# Patient Record
Sex: Female | Born: 1990 | Race: Black or African American | Hispanic: No | Marital: Single | Smoking: Never smoker
Health system: Southern US, Community
[De-identification: ages and names within clinical notes are randomized; demographics above are authoritative.]

## PROBLEM LIST (undated history)

## (undated) DIAGNOSIS — J45909 Unspecified asthma, uncomplicated: Secondary | ICD-10-CM

## (undated) DIAGNOSIS — L309 Dermatitis, unspecified: Secondary | ICD-10-CM

## (undated) HISTORY — PX: EYE SURGERY: SHX253

## (undated) HISTORY — PX: HERNIA REPAIR: SHX51

---
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
## 2012-07-23 ENCOUNTER — Encounter (HOSPITAL_COMMUNITY): Payer: Self-pay | Admitting: Emergency Medicine

## 2012-07-23 ENCOUNTER — Emergency Department (INDEPENDENT_AMBULATORY_CARE_PROVIDER_SITE_OTHER)
Admission: EM | Admit: 2012-07-23 | Discharge: 2012-07-23 | Disposition: A | Discharge status: Home / Self Care | Payer: Medicaid Other | Source: Home / Self Care | Attending: Family Medicine | Admitting: Family Medicine

## 2012-07-23 DIAGNOSIS — J069 Acute upper respiratory infection, unspecified: Secondary | ICD-10-CM

## 2012-07-23 MED ORDER — IPRATROPIUM BROMIDE 0.06 % NA SOLN: 2.0000 | Freq: Four times a day (QID) | NASAL | Status: DC

## 2012-07-23 NOTE — ED Provider Notes (Signed)
History     CSN: [id number]  Arrival date & time 07/23/12  1707   First MD Initiated Contact with Patient 07/23/12 1727      Chief Complaint  Patient presents with  . URI    (Consider location/radiation/quality/duration/timing/severity/associated sxs/prior treatment) Patient is a 21 y.o. female presenting with URI. The history is provided by the patient.  URI The primary symptoms include sore throat. Primary symptoms do not include fever, cough or wheezing. The current episode started more than 1 week ago. This is a new problem. The problem has not changed since onset. Symptoms associated with the illness include congestion and rhinorrhea.    History reviewed. No pertinent past medical history.  Past Surgical History  Procedure Date  . Eye surgery     No family history on file.  History  Substance Use Topics  . Smoking status: Never Smoker   . Smokeless tobacco: Not on file  . Alcohol Use: No    OB History    Grav Para Term Preterm Abortions TAB SAB Ect Mult Living                  Review of Systems  Constitutional: Negative.  Negative for fever.  HENT: Positive for congestion, sore throat, rhinorrhea and postnasal drip.   Respiratory: Negative for cough and wheezing.   Gastrointestinal: Negative.   Skin: Negative.     Allergies  Nickel  Home Medications   Current Outpatient Rx  Name  Route  Sig  Dispense  Refill  . IPRATROPIUM BROMIDE 0.06 % NA SOLN   Nasal   Place 2 sprays into the nose 4 (four) times daily.   15 mL   1     BP 132/85  Pulse 77  Temp 99.9 F (37.7 C) (Oral)  Resp 18  SpO2 99%  LMP 06/30/2012  Physical Exam  Nursing note and vitals reviewed. Constitutional: She is oriented to person, place, and time. She appears well-developed and well-nourished.  HENT:  Head: Normocephalic.  Right Ear: External ear normal.  Left Ear: External ear normal.  Nose: Mucosal edema and rhinorrhea present.  Mouth/Throat: Oropharynx is clear  and moist.  Neck: Normal range of motion. Neck supple.  Pulmonary/Chest: Breath sounds normal.  Abdominal: Soft. Bowel sounds are normal.  Lymphadenopathy:    She has no cervical adenopathy.  Neurological: She is alert and oriented to person, place, and time.  Skin: Skin is warm and dry.    ED Course  Procedures (including critical care time)   Labs Reviewed  POCT RAPID STREP A ([facility] ONLY)   No results found.   1. URI (upper respiratory infection)       MDM          Linna Hoff, MD 07/23/12 [phone]

## 2012-07-23 NOTE — ED Notes (Signed)
Pt c/o cold sx x2 weeks... Sx include: cough, sore throat, headaches, abd pain, dysphagia... Denies: fevers, vomiting, nauseas, diarrhea... She is alert w/no signs of acute distress.

## 2013-04-16 ENCOUNTER — Inpatient Hospital Stay (HOSPITAL_COMMUNITY)
Admission: AD | Admit: 2013-04-16 | Discharge: 2013-04-16 | Disposition: A | Discharge status: Home / Self Care | Payer: Medicaid Other | Source: Ambulatory Visit | Attending: Obstetrics & Gynecology | Admitting: Obstetrics & Gynecology

## 2013-04-16 ENCOUNTER — Encounter (HOSPITAL_COMMUNITY): Payer: Self-pay | Admitting: *Deleted

## 2013-04-16 DIAGNOSIS — N6089 Other benign mammary dysplasias of unspecified breast: Secondary | ICD-10-CM

## 2013-04-16 DIAGNOSIS — N6081 Other benign mammary dysplasias of right breast: Secondary | ICD-10-CM

## 2013-04-16 DIAGNOSIS — D2370 Other benign neoplasm of skin of unspecified lower limb, including hip: Secondary | ICD-10-CM | POA: Insufficient documentation

## 2013-04-16 DIAGNOSIS — L723 Sebaceous cyst: Secondary | ICD-10-CM | POA: Insufficient documentation

## 2013-04-16 DIAGNOSIS — N63 Unspecified lump in unspecified breast: Secondary | ICD-10-CM | POA: Insufficient documentation

## 2013-04-16 DIAGNOSIS — N6019 Diffuse cystic mastopathy of unspecified breast: Secondary | ICD-10-CM | POA: Insufficient documentation

## 2013-04-16 HISTORY — DX: Unspecified asthma, uncomplicated: J45.909

## 2013-04-16 HISTORY — DX: Dermatitis, unspecified: L30.9

## 2013-04-16 LAB — POCT PREGNANCY, URINE: Preg Test, Ur: NEGATIVE

## 2013-04-16 LAB — URINALYSIS, ROUTINE W REFLEX MICROSCOPIC
Bilirubin Urine: NEGATIVE
Glucose, UA: NEGATIVE mg/dL
Hgb urine dipstick: NEGATIVE
Ketones, ur: NEGATIVE mg/dL
Protein, ur: NEGATIVE mg/dL

## 2013-04-16 NOTE — MAU Provider Note (Signed)
History     CSN: [id number]  Arrival date and time: 04/16/13 1616   First Provider Initiated Contact with Patient 04/16/13 1657      Chief Complaint  Patient presents with  . lump in right breast    HPI Shawna George is 22 y.o. presents for evaluation of a lump in the right breast.  Denies nipple discharge.  She first noticed it 4 months.  Was larger initially but now smaller.  Denies tenderness.  It does not change with her menstrual cycles.  LMP 03/17/13.  She has 2 small moles on her right thigh that have been there "forever" but wants them examined.  Never sexually active.     Past Medical History  Diagnosis Date  . Eczema     bilat arms  . Asthma     Past Surgical History  Procedure Laterality Date  . Eye surgery      Family History  Problem Relation Age of Onset  . Cancer Mother   . Lupus Mother   . Stroke Mother   . Hypertension Mother   . Hyperlipidemia Mother   . Depression Mother   . Hypertension Father   . Cancer Maternal Grandmother     History  Substance Use Topics  . Smoking status: Never Smoker   . Smokeless tobacco: Not on file  . Alcohol Use: No    Allergies:  Allergies  Allergen Reactions  . Nickel Hives and Rash    No prescriptions prior to admission    ROS  Neg for fever, chills HENT-neg Breast-right breast+ for lump, Left breast Neg Abd-Neg for pain Pelvic=Neg for abnormal vaginal bleeding or discharge   Physical Exam   Blood pressure 133/79, pulse 75, temperature 98.2 F (36.8 C), temperature source Oral, resp. rate 18, height 5\' 11"  (1.803 m), weight 166 lb (75.297 kg), last menstrual period 03/17/2013.\   Physical Exam Patient is alert, oriented without distress Breast: Scattered Fibrocystic breast tissue bilaterally without dominant masses or nipple discharge                At the edge of the right nipple at 11 o'clock, there is a firm nodule that is clearly under the skin, not involving the breast tissue.  It is  without redness, tenderness or discharge.   Right lateral thigh--patient points out 2 small dark brown moles that are round with clear borders.  Non tender to touch   Results for orders placed during the hospital encounter of 04/16/13 (from the past 24 hour(s))  URINALYSIS, ROUTINE W REFLEX MICROSCOPIC     Status: Abnormal   Collection Time    04/16/13  4:30 PM      Result Value Range   Color, Urine YELLOW  YELLOW   APPearance CLEAR  CLEAR   Specific Gravity, Urine >1.030 (*) 1.005 - 1.030   pH 6.0  5.0 - 8.0   Glucose, UA NEGATIVE  NEGATIVE mg/dL   Hgb urine dipstick NEGATIVE  NEGATIVE   Bilirubin Urine NEGATIVE  NEGATIVE   Ketones, ur NEGATIVE  NEGATIVE mg/dL   Protein, ur NEGATIVE  NEGATIVE mg/dL   Urobilinogen, UA 0.2  0.0 - 1.0 mg/dL   Nitrite NEGATIVE  NEGATIVE   Leukocytes, UA NEGATIVE  NEGATIVE  POCT PREGNANCY, URINE     Status: None   Collection Time    04/16/13  4:37 PM      Result Value Range   Preg Test, Ur NEGATIVE  NEGATIVE   MAU Course  Procedures  MDM   Assessment and Plan  A:  Sebaceous cyst right nipple      Fibrocystic breast       2 small moles on her outer right thigh  P:  Encourage patient not to try to squeeze the cyst       Instructed that if area becomes red or tender, she will need to see a PCP or GYN for evaluation       Also if the moles change, she will need to see dermatologist.   Matt Holmes 04/16/2013, 4:59 PM

## 2013-04-16 NOTE — MAU Note (Signed)
Pt c/o non-tender,right breast lump, approx size of M&M for the past 4 months. Denies any bleeding or leaking from nipple. Never has been sexually active.

## 2013-04-16 NOTE — MAU Note (Signed)
Pt presents with complaints of a lump in her right breast 4 months ago. She states that she told her mother about the lump today and she told her to come and get it evaluated.

## 2013-05-17 ENCOUNTER — Emergency Department (HOSPITAL_COMMUNITY): Payer: Medicaid Other

## 2013-05-17 ENCOUNTER — Emergency Department (HOSPITAL_COMMUNITY)
Admission: EM | Admit: 2013-05-17 | Discharge: 2013-05-17 | Disposition: A | Discharge status: Home / Self Care | Payer: Medicaid Other | Source: Home / Self Care

## 2013-05-17 ENCOUNTER — Emergency Department (INDEPENDENT_AMBULATORY_CARE_PROVIDER_SITE_OTHER): Payer: Medicaid Other

## 2013-05-17 ENCOUNTER — Encounter (HOSPITAL_COMMUNITY): Payer: Self-pay | Admitting: Emergency Medicine

## 2013-05-17 DIAGNOSIS — M79604 Pain in right leg: Secondary | ICD-10-CM

## 2013-05-17 DIAGNOSIS — M79609 Pain in unspecified limb: Secondary | ICD-10-CM

## 2013-05-17 NOTE — ED Notes (Signed)
Pt  Reports  She   inj  l  Leg  6  Days  Ago  She  Reports   She  Was  Chopping  Wood  And  A  Projectile  flew   And hit  Her l  Evette Cristal      She  Has  Bruising  Present    With  Pain on  Palpation

## 2013-05-17 NOTE — ED Provider Notes (Signed)
CSN: [id number]     Arrival date & time 05/17/13  1020 History   None    Chief Complaint  Patient presents with  . Leg Injury   HPI Pleasant 22 year old [place] American female with no significant past medical history who was chopping wood about a week ago for grandparents when the stump that she was chopping flu floor and hit her on her anterior right shin. She has been tolerating 6 on 10 pain which is localized primary to that area and has been able to ambulate but with difficulty she's not tried any medications for this. 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 Eczema     bilat arms  . Asthma    Past Surgical History  Procedure Laterality Date  . Eye surgery     Family History  Problem Relation Age of Onset  . Cancer Mother   . Lupus Mother   . Stroke Mother   . Hypertension Mother   . Hyperlipidemia Mother   . Depression Mother   . Hypertension Father   . Cancer Maternal Grandmother    History  Substance Use Topics  . Smoking status: Never Smoker   . Smokeless tobacco: Not on file  . Alcohol Use: No   OB History   Grav Para Term Preterm Abortions TAB SAB Ect Mult Living                 Review of Systems No cough no cold no fever no chills no nausea no vomiting Pain in the right lower extremity 6/10 Not always in no erythema  Allergies  Nickel  Home Medications  No current outpatient prescriptions on file. 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 Inversion eversion of the ankle reveals no tenderness or pain  ED Course  Procedures (including critical care time) Labs Review Labs Reviewed - No data to display Imaging Review No results found.  EKG Interpretation     Ventricular Rate:    PR Interval:    QRS Duration:   QT Interval:    QTC Calculation:   R Axis:     Text Interpretation:              MDM  No diagnosis found. 11:15 AM ordered foot x-ray/tibia-fibula x-ray to rule out any occult fracture although I think this is unlikely  11:36 AM Foot x-ray/tibia-fibula x-ray my read = no acute fracture explained to patient that this will probably all resolve in the next 10-15 days in terms of the swelling, and that the ecchymosis at the bottom of the ankle is probably because of dependency. She doesn't require now for work and does not want any medications for pain.        Rhetta Mura, MD 05/17/13 1136

## 2014-08-03 ENCOUNTER — Emergency Department (HOSPITAL_COMMUNITY): Payer: BLUE CROSS/BLUE SHIELD

## 2014-08-03 ENCOUNTER — Emergency Department (HOSPITAL_COMMUNITY)
Admission: EM | Admit: 2014-08-03 | Discharge: 2014-08-03 | Disposition: A | Discharge status: Home / Self Care | Payer: BLUE CROSS/BLUE SHIELD | Attending: Emergency Medicine | Admitting: Emergency Medicine

## 2014-08-03 ENCOUNTER — Encounter (HOSPITAL_COMMUNITY): Payer: Self-pay | Admitting: *Deleted

## 2014-08-03 DIAGNOSIS — Y998 Other external cause status: Secondary | ICD-10-CM | POA: Diagnosis not present

## 2014-08-03 DIAGNOSIS — W270XXA Contact with workbench tool, initial encounter: Secondary | ICD-10-CM | POA: Insufficient documentation

## 2014-08-03 DIAGNOSIS — Z23 Encounter for immunization: Secondary | ICD-10-CM | POA: Diagnosis not present

## 2014-08-03 DIAGNOSIS — Y9389 Activity, other specified: Secondary | ICD-10-CM | POA: Diagnosis not present

## 2014-08-03 DIAGNOSIS — Z872 Personal history of diseases of the skin and subcutaneous tissue: Secondary | ICD-10-CM | POA: Insufficient documentation

## 2014-08-03 DIAGNOSIS — Y9289 Other specified places as the place of occurrence of the external cause: Secondary | ICD-10-CM | POA: Diagnosis not present

## 2014-08-03 DIAGNOSIS — T1490XA Injury, unspecified, initial encounter: Secondary | ICD-10-CM

## 2014-08-03 DIAGNOSIS — T148XXA Other injury of unspecified body region, initial encounter: Secondary | ICD-10-CM

## 2014-08-03 DIAGNOSIS — J45909 Unspecified asthma, uncomplicated: Secondary | ICD-10-CM | POA: Diagnosis not present

## 2014-08-03 DIAGNOSIS — S81832A Puncture wound without foreign body, left lower leg, initial encounter: Secondary | ICD-10-CM | POA: Insufficient documentation

## 2014-08-03 MED ORDER — TETANUS-DIPHTH-ACELL PERTUSSIS 5-2.5-18.5 LF-MCG/0.5 IM SUSP
0.5000 mL | Freq: Once | INTRAMUSCULAR | Status: AC
  Administered 2014-08-03: 0.5 mL via INTRAMUSCULAR
  Filled 2014-08-03: qty 0.5

## 2014-08-03 NOTE — ED Provider Notes (Signed)
CSN: [id number]     Arrival date & time 08/03/14  1125 History  This chart was scribed for non-physician practitioner, Glendell Docker, NP-C working with Charlesetta Shanks, MD by Einar Pheasant, ED scribe. This patient was seen in room TR07C/TR07C and the patient's care was started at 12:51 PM.    Chief Complaint  Patient presents with  . Foreign Body   The history is provided by the patient. No language interpreter was used.   HPI Comments: Shawna George is a 24 y.o. female with a PMhx of Eczema and asthama presents to the Emergency Department complaining of a possible foreign body to anterior aspect of left leg. Pt states that she was chopping wood with a metal wedge and she things that a piece of metal or wood is lodged in her left leg. Pt has an open wound to the affected leg. Bleeding is controlled. Pt does not recall the date of her last tetanus vaccine. Denies any fever, chills, nausea, emesis, abdominal pain, weakness, numbness, leg swelling, or chest pain.   Past Medical History  Diagnosis Date  . Eczema     bilat arms  . Asthma    Past Surgical History  Procedure Laterality Date  . Eye surgery    . Hernia repair     Family History  Problem Relation Age of Onset  . Cancer Mother   . Lupus Mother   . Stroke Mother   . Hypertension Mother   . Hyperlipidemia Mother   . Depression Mother   . Hypertension Father   . Cancer Maternal Grandmother    History  Substance Use Topics  . Smoking status: Never Smoker   . Smokeless tobacco: Not on file  . Alcohol Use: Yes   OB History    No data available     Review of Systems  Constitutional: Negative for fever and chills.  HENT: Negative for congestion.   Respiratory: Negative for shortness of breath.   Cardiovascular: Negative for chest pain and leg swelling.  Gastrointestinal: Negative for abdominal pain.  Musculoskeletal: Negative for back pain.  Skin: Positive for wound.  Neurological: Negative for weakness,  numbness and headaches.  All other systems reviewed and are negative.     Allergies  Nickel  Home Medications   Prior to Admission medications   Not on File   BP 132/78 mmHg  Pulse 81  Temp(Src) 98.7 F (37.1 C) (Oral)  Resp 18  Ht 6' (1.829 m)  Wt 175 lb (79.379 kg)  BMI 23.73 kg/m2  SpO2 100%  Physical Exam  Constitutional: She is oriented to person, place, and time. She appears well-developed and well-nourished. No distress.  HENT:  Head: Normocephalic and atraumatic.  Eyes: Conjunctivae and EOM are normal.  Neck: Neck supple.  Cardiovascular: Normal rate.   Pulmonary/Chest: Effort normal. No respiratory distress.  Musculoskeletal: Normal range of motion.  Neurological: She is alert and oriented to person, place, and time.  Skin: Skin is warm and dry.  .5cm puncture wound  To the left shin, no redness or warmth noted to the area  Psychiatric: She has a normal mood and affect. Her behavior is normal.  Nursing note and vitals reviewed.   ED Course  Procedures (including critical care time)  DIAGNOSTIC STUDIES: Oxygen Saturation is 100% on RA, normal by my interpretation.    COORDINATION OF CARE: 12:53 PM- Will order X-ray of left leg. Pt advised of plan for treatment and pt agrees.   Imaging Review Dg Tibia/fibula Left  08/03/2014   CLINICAL DATA:  Injured while splitting wood with left leg laceration  EXAM: LEFT TIBIA AND FIBULA - 2 VIEW  COMPARISON:  None.  FINDINGS: Soft tissue defect is noted in the anterior lower leg consistent with a recent history. No acute bony abnormality is seen.  IMPRESSION: Soft tissue injury without acute fracture.   Electronically Signed   By: Inez Catalina M.D.   On: 08/03/2014 13:12     MDM   Final diagnoses:  Puncture wound    No fb noted.no sign of infection to the area. Tetanus updated. Discussed return precautions  I personally performed the services described in this documentation, which was scribed in my presence.  The recorded information has been reviewed and is accurate.    Glendell Docker, NP 08/03/14 [place], MD 08/05/14 [phone]

## 2014-08-03 NOTE — Discharge Instructions (Signed)
Puncture Wound °A puncture wound is an injury that extends through all layers of the skin and into the tissue beneath the skin (subcutaneous tissue). Puncture wounds become infected easily because germs often enter the body and go beneath the skin during the injury. Having a deep wound with a small entrance point makes it difficult for your caregiver to adequately clean the wound. This is especially true if you have stepped on a nail and it has passed through a dirty shoe or other situations where the wound is obviously contaminated. °CAUSES  °Many puncture wounds involve glass, nails, splinters, fish hooks, or other objects that enter the skin (foreign bodies). A puncture wound may also be caused by a human bite or animal bite. °DIAGNOSIS  °A puncture wound is usually diagnosed by your history and a physical exam. You may need to have an X-ray or an ultrasound to check for any foreign bodies still in the wound. °TREATMENT  °· Your caregiver will clean the wound as thoroughly as possible. Depending on the location of the wound, a bandage (dressing) may be applied. °· Your caregiver might prescribe antibiotic medicines. °· You may need a follow-up visit to check on your wound. Follow all instructions as directed by your caregiver. °HOME CARE INSTRUCTIONS  °· Change your dressing once per day, or as directed by your caregiver. If the dressing sticks, it may be removed by soaking the area in water. °· If your caregiver has given you follow-up instructions, it is very important that you return for a follow-up appointment. Not following up as directed could result in a chronic or permanent injury, pain, and disability. °· Only take over-the-counter or prescription medicines for pain, discomfort, or fever as directed by your caregiver. °· If you are given antibiotics, take them as directed. Finish them even if you start to feel better. °You may need a tetanus shot if: °· You cannot remember when you had your last tetanus  shot. °· You have never had a tetanus shot. °If you got a tetanus shot, your arm may swell, get red, and feel warm to the touch. This is common and not a problem. If you need a tetanus shot and you choose not to have one, there is a rare chance of getting tetanus. Sickness from tetanus can be serious. °You may need a rabies shot if an animal bite caused your puncture wound. °SEEK MEDICAL CARE IF:  °· You have redness, swelling, or increasing pain in the wound. °· You have red streaks going away from the wound. °· You notice a bad smell coming from the wound or dressing. °· You have yellowish-white fluid (pus) coming from the wound. °· You are treated with an antibiotic for infection, but the infection is not getting better. °· You notice something in the wound, such as rubber from your shoe, cloth, or another object. °· You have a fever. °· You have severe pain. °· You have difficulty breathing. °· You feel dizzy or faint. °· You cannot stop vomiting. °· You lose feeling, develop numbness, or cannot move a limb below the wound. °· Your symptoms worsen. °MAKE SURE YOU: °· Understand these instructions. °· Will watch your condition. °· Will get help right away if you are not doing well or get worse. °Document Released: 04/22/2005 Document Revised: 10/05/2011 Document Reviewed: 12/30/2010 °Exit[employer]® Patient Information ©2015 [employer]. This information is not intended to replace advice given to you by your health care provider. Make sure you discuss any questions you   have with your health care provider. ° °

## 2014-08-03 NOTE — ED Notes (Signed)
Patient was chopping wood with a metal wedge.  She states she thinks a piece of metal or wood is in her left leg.  She has an open wound with bandaid.  Patient has small amount of bleeding.  Patient with no fevers.  Patient has pain with weight bearing and twisting motion.  Patient is unsure about her tetenus.  She did not take any pain meds prior to arrival

## 2016-02-22 ENCOUNTER — Emergency Department (HOSPITAL_COMMUNITY)
Admission: EM | Admit: 2016-02-22 | Discharge: 2016-02-22 | Disposition: A | Discharge status: Home / Self Care | Payer: Self-pay | Attending: Emergency Medicine | Admitting: Emergency Medicine

## 2016-02-22 ENCOUNTER — Encounter (HOSPITAL_COMMUNITY): Payer: Self-pay

## 2016-02-22 ENCOUNTER — Emergency Department (HOSPITAL_COMMUNITY): Payer: Self-pay

## 2016-02-22 DIAGNOSIS — J45909 Unspecified asthma, uncomplicated: Secondary | ICD-10-CM | POA: Insufficient documentation

## 2016-02-22 DIAGNOSIS — N12 Tubulo-interstitial nephritis, not specified as acute or chronic: Secondary | ICD-10-CM | POA: Insufficient documentation

## 2016-02-22 DIAGNOSIS — N390 Urinary tract infection, site not specified: Secondary | ICD-10-CM | POA: Insufficient documentation

## 2016-02-22 LAB — URINALYSIS, ROUTINE W REFLEX MICROSCOPIC
Bilirubin Urine: NEGATIVE
Glucose, UA: NEGATIVE mg/dL
Ketones, ur: NEGATIVE mg/dL
NITRITE: NEGATIVE
PROTEIN: 100 mg/dL — AB
Specific Gravity, Urine: 1.016 (ref 1.005–1.030)
pH: 7.5 (ref 5.0–8.0)

## 2016-02-22 LAB — COMPREHENSIVE METABOLIC PANEL
ALBUMIN: 4.5 g/dL (ref 3.5–5.0)
ALT: 16 U/L (ref 14–54)
ANION GAP: 6 (ref 5–15)
AST: 21 U/L (ref 15–41)
Alkaline Phosphatase: 55 U/L (ref 38–126)
BILIRUBIN TOTAL: 1.2 mg/dL (ref 0.3–1.2)
BUN: 11 mg/dL (ref 6–20)
CO2: 26 mmol/L (ref 22–32)
Calcium: 9.4 mg/dL (ref 8.9–10.3)
Chloride: 104 mmol/L (ref 101–111)
Creatinine, Ser: 0.85 mg/dL (ref 0.44–1.00)
GFR calc Af Amer: >60 mL/min (ref >60)
GFR calc non Af Amer: >60 mL/min (ref >60)
GLUCOSE: 113 mg/dL — AB (ref 65–99)
POTASSIUM: 4.1 mmol/L (ref 3.5–5.1)
SODIUM: 136 mmol/L (ref 135–145)
TOTAL PROTEIN: 7.9 g/dL (ref 6.5–8.1)

## 2016-02-22 LAB — CBC
HEMATOCRIT: 38.6 % (ref 36.0–46.0)
HEMOGLOBIN: 12.7 g/dL (ref 12.0–15.0)
MCH: 27.9 pg (ref 26.0–34.0)
MCHC: 32.9 g/dL (ref 30.0–36.0)
MCV: 84.6 fL (ref 78.0–100.0)
Platelets: 281 10*3/uL (ref 150–400)
RBC: 4.56 MIL/uL (ref 3.87–5.11)
RDW: 13.6 % (ref 11.5–15.5)
WBC: 14.5 10*3/uL — ABNORMAL HIGH (ref 4.0–10.5)

## 2016-02-22 LAB — URINE MICROSCOPIC-ADD ON

## 2016-02-22 LAB — POC URINE PREG, ED: PREG TEST UR: NEGATIVE

## 2016-02-22 LAB — LIPASE, BLOOD: LIPASE: 26 U/L (ref 11–51)

## 2016-02-22 MED ORDER — PROMETHAZINE HCL 25 MG PO TABS: 25.0000 mg | ORAL_TABLET | Freq: Four times a day (QID) | ORAL | 0 refills | Status: AC | PRN

## 2016-02-22 MED ORDER — CEPHALEXIN 500 MG PO CAPS: 500.0000 mg | ORAL_CAPSULE | Freq: Three times a day (TID) | ORAL | 0 refills | Status: AC

## 2016-02-22 MED ORDER — DEXTROSE 5 % IV SOLN
1.0000 g | Freq: Once | INTRAVENOUS | Status: AC
  Administered 2016-02-22: 1 g via INTRAVENOUS
  Filled 2016-02-22: qty 10

## 2016-02-22 MED ORDER — KETOROLAC TROMETHAMINE 30 MG/ML IJ SOLN
30.0000 mg | Freq: Once | INTRAMUSCULAR | Status: AC
  Administered 2016-02-22: 30 mg via INTRAVENOUS
  Filled 2016-02-22: qty 1

## 2016-02-22 MED ORDER — SODIUM CHLORIDE 0.9 % IV BOLUS (SEPSIS)
1000.0000 mL | Freq: Once | INTRAVENOUS | Status: AC
  Administered 2016-02-22: 1000 mL via INTRAVENOUS

## 2016-02-22 NOTE — ED Triage Notes (Signed)
She c/o right-sided low abd. Discomfort x 2-3 days; also she has seen some blood in her urine.  She is in no distress.

## 2016-02-22 NOTE — ED Notes (Signed)
[place] will arrive from [facility] to perform US renal exam.

## 2016-02-22 NOTE — ED Provider Notes (Signed)
[facility] DEPT Provider Note   CSN: [id number] Arrival date & time: 02/22/16  1059  First Provider Contact:  First MD Initiated Contact with Patient 02/22/16 1220        History   Chief Complaint Chief Complaint  Patient presents with  . Abdominal Pain    HPI Shawna George is a 25 y.o. female.  25 year old female with history of asthma presents for back pain and right lower abdominal pain. The patient states that she has developed pain on her right side over the last 2-3 days. She reports she has also developed blood in her urine. She has felt chilled. She has also been fatigued. She denies any nausea or vomiting. No diarrhea or constipation. She denies any fever.      Past Medical History:  Diagnosis Date  . Asthma   . Eczema    bilat arms    There are no active problems to display for this patient.   Past Surgical History:  Procedure Laterality Date  . EYE SURGERY    . HERNIA REPAIR      OB History    No data available       Home Medications    Prior to Admission medications   Not on File    Family History Family History  Problem Relation Age of Onset  . Cancer Mother   . Lupus Mother   . Stroke Mother   . Hypertension Mother   . Hyperlipidemia Mother   . Depression Mother   . Hypertension Father   . Cancer Maternal Grandmother     Social History Social History  Substance Use Topics  . Smoking status: Never Smoker  . Smokeless tobacco: Not on file  . Alcohol use Yes     Allergies   Nickel   Review of Systems Review of Systems  Constitutional: Positive for chills and fatigue. Negative for diaphoresis and fever.  HENT: Negative for congestion, rhinorrhea and sinus pressure.   Eyes: Negative for visual disturbance.  Respiratory: Negative for cough, chest tightness, shortness of breath and wheezing.   Cardiovascular: Negative for chest pain and palpitations.  Gastrointestinal: Positive for abdominal pain. Negative for  constipation, diarrhea, nausea and vomiting.  Genitourinary: Positive for dysuria, flank pain (right) and hematuria. Negative for urgency.  Musculoskeletal: Positive for back pain. Negative for myalgias.  Skin: Negative for rash.  Neurological: Negative for dizziness, weakness, light-headedness and headaches.  Hematological: Does not bruise/bleed easily.     Physical Exam Updated Vital Signs BP 130/86 (BP Location: Left Arm)   Pulse 60   Temp 97.4 F (36.3 C) (Oral)   Resp 19   LMP 02/14/2016 (Exact Date)   SpO2 99%   Physical Exam  Constitutional: She is oriented to person, place, and time. She appears well-developed and well-nourished. No distress.  HENT:  Head: Normocephalic and atraumatic.  Right Ear: External ear normal.  Left Ear: External ear normal.  Nose: Nose normal.  Mouth/Throat: Oropharynx is clear and moist. No oropharyngeal exudate.  Eyes: EOM are normal. Pupils are equal, round, and reactive to light.  Neck: Normal range of motion. Neck supple.  Cardiovascular: Normal rate, regular rhythm, normal heart sounds and intact distal pulses.   No murmur heard. Pulmonary/Chest: Effort normal. No respiratory distress. She has no wheezes. She has no rales.  Abdominal: Soft. She exhibits no distension. There is no tenderness. There is CVA tenderness (right-sided).  Musculoskeletal: Normal range of motion. She exhibits no edema or tenderness.  Neurological: She is  alert and oriented to person, place, and time.  Skin: Skin is warm and dry. No rash noted. She is not diaphoretic.  Vitals reviewed.    ED Treatments / Results  Labs (all labs ordered are listed, but only abnormal results are displayed) Labs Reviewed  URINALYSIS, ROUTINE W REFLEX MICROSCOPIC (NOT AT [facility]) - Abnormal; Notable for the following:       Result Value   APPearance CLOUDY (*)    Hgb urine dipstick LARGE (*)    Protein, ur 100 (*)    Leukocytes, UA MODERATE (*)    All other components within  normal limits  COMPREHENSIVE METABOLIC PANEL - Abnormal; Notable for the following:    Glucose, Bld 113 (*)    All other components within normal limits  CBC - Abnormal; Notable for the following:    WBC 14.5 (*)    All other components within normal limits  URINE MICROSCOPIC-ADD ON - Abnormal; Notable for the following:    Squamous Epithelial / LPF 0-5 (*)    Bacteria, UA FEW (*)    All other components within normal limits  URINE CULTURE  LIPASE, BLOOD  POC URINE PREG, ED    EKG  EKG Interpretation None       Radiology No results found.  Procedures Procedures (including critical care time)  Medications Ordered in ED Medications  ketorolac (TORADOL) 30 MG/ML injection 30 mg (30 mg Intravenous Given 02/22/16 1328)  sodium chloride 0.9 % bolus 1,000 mL (0 mLs Intravenous Stopped 02/22/16 1520)  cefTRIAXone (ROCEPHIN) 1 g in dextrose 5 % 50 mL IVPB (0 g Intravenous Stopped 02/22/16 1407)     Initial Impression / Assessment and Plan / ED Course  I have reviewed the triage vital signs and the nursing notes.  Pertinent labs & imaging results that were available during my care of the patient were reviewed by me and considered in my medical decision making (see chart for details).  Clinical Course    Patient was seen and evaluated in stable condition.  Laboratory studies consistent with pyelonephritis. Patient with right-sided CVA tenderness. No abdominal tenderness. On reevaluation patient felt significantly improved after IV fluids and Toradol. Patient was given a dose of Rocephin.  Patient signed out pending renal US for evaluation of obstruction.  Plan for discharge on Keflex if no sign of urinary obstruction.  Final Clinical Impressions(s) / ED Diagnoses   Final diagnoses:  Pyelonephritis    New Prescriptions New Prescriptions   No medications on file     Harvel Quale, MD 02/22/16 [phone]

## 2016-02-22 NOTE — ED Notes (Signed)
Pt. Verbalized understanding of teaching.

## 2016-02-25 LAB — URINE CULTURE: Culture: >=100000 — AB

## 2016-02-26 ENCOUNTER — Telehealth (HOSPITAL_BASED_OUTPATIENT_CLINIC_OR_DEPARTMENT_OTHER): Payer: Self-pay | Admitting: Emergency Medicine

## 2016-02-26 NOTE — Telephone Encounter (Signed)
Post ED Visit - Positive Culture Follow-up  Culture report reviewed by antimicrobial stewardship pharmacist:  []  Elenor Quinones, Pharm.D. []  Heide Guile, Pharm.D., BCPS []  Parks Neptune, Pharm.D. []  Alycia Rossetti, Pharm.D., BCPS []  [place], [place].D., BCPS, AAHIVP []  Legrand Como, Pharm.D., BCPS, AAHIVP []  Milus Glazier, Pharm.D. []  Stephens November, Pharm.D. Dimitri Ped PharmD  Positive urine culture Treated with cephalexin, organism sensitive to the same and no further patient follow-up is required at this time.  Hazle Nordmann 02/26/2016, 9:31 AM

## 2016-12-23 IMAGING — US US RENAL
1 series · 14 of 25 positions shown · non-contrast
Comparison: None.

CLINICAL DATA: Right flank pain for 3 days. Extreme pain today.
Hematuria.

EXAM:
RENAL / URINARY TRACT ULTRASOUND COMPLETE

[Series 1: us renal · 0.19mm/px · 14 of 29 slices shown]
[im 1/29]
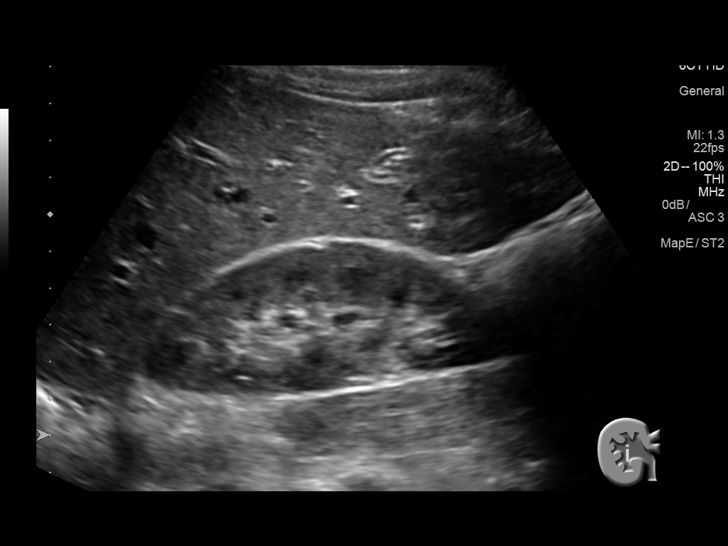
[im 3/29]
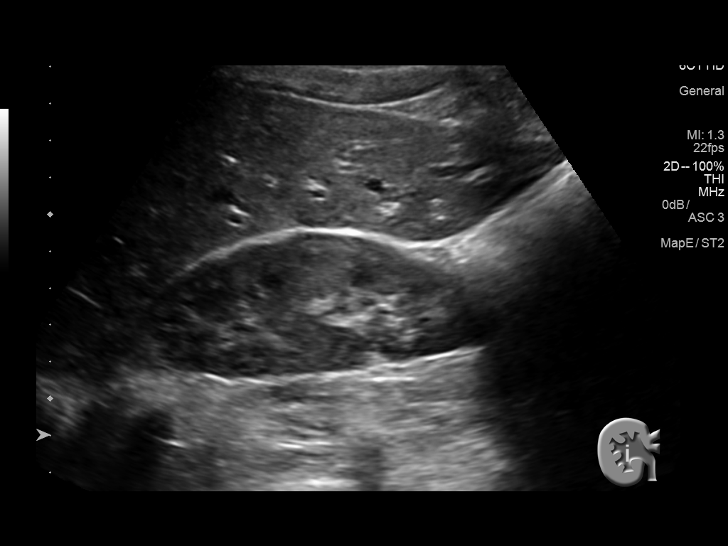
[im 5/29]
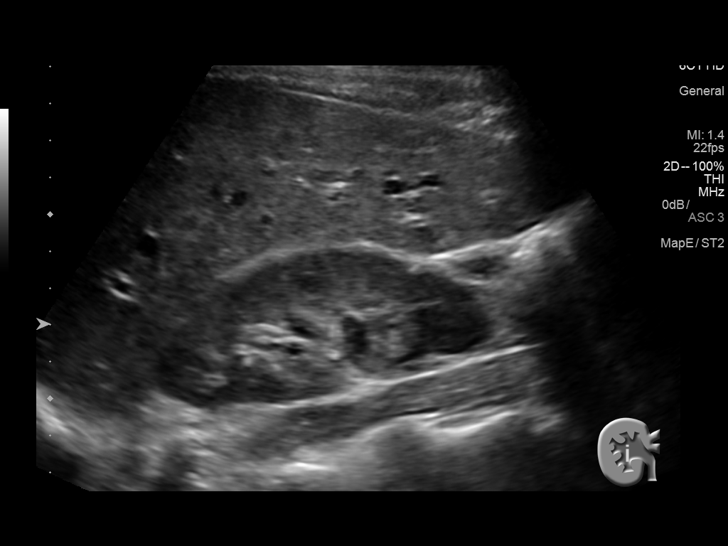
[im 8/29]
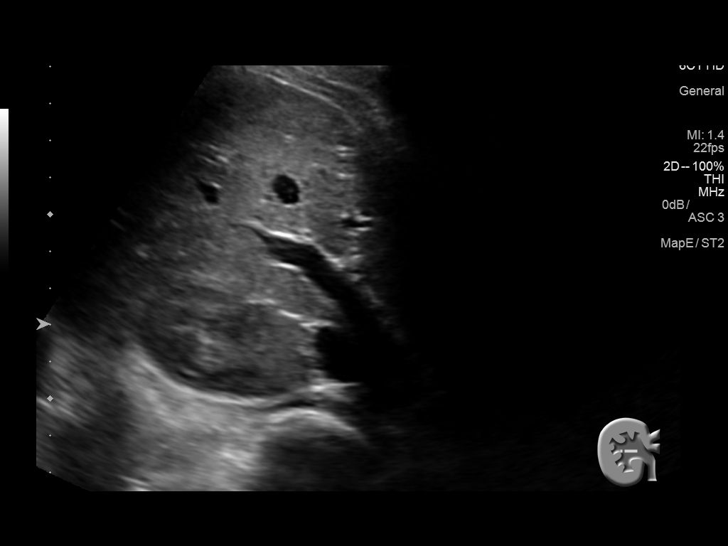
[im 10/29]
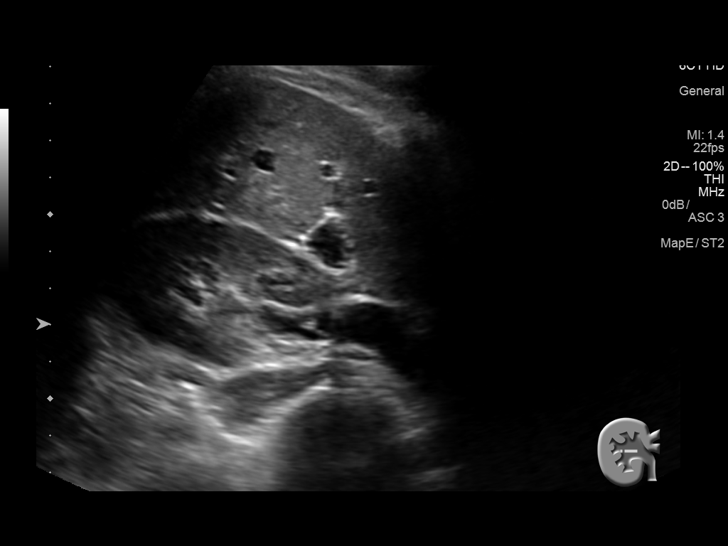
[im 11/29]
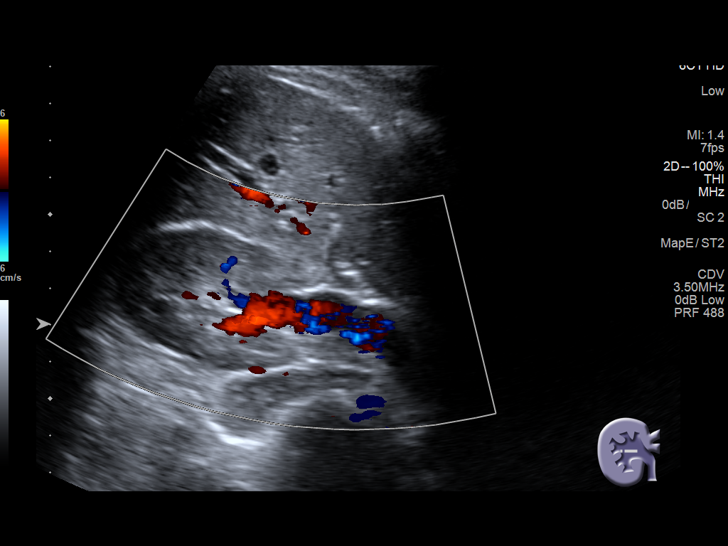
[im 13/29]
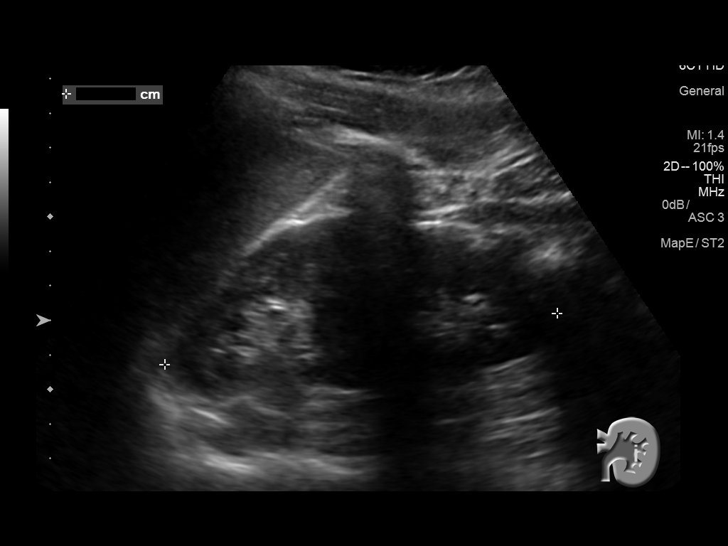
[im 16/29]
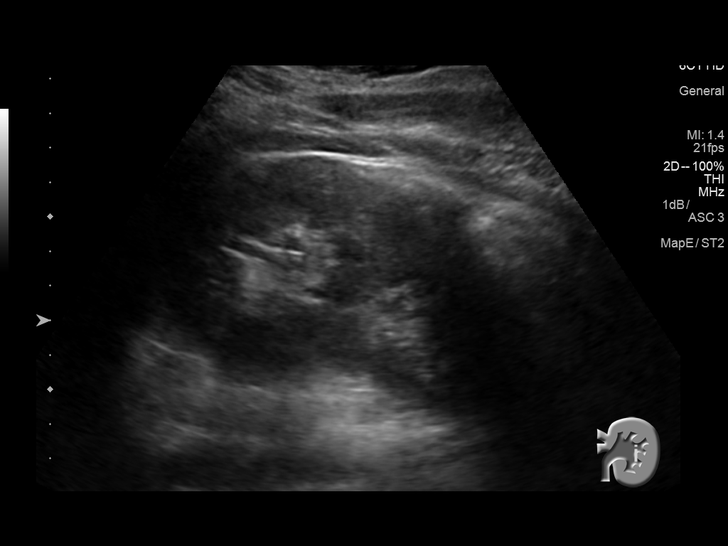
[im 18/29]
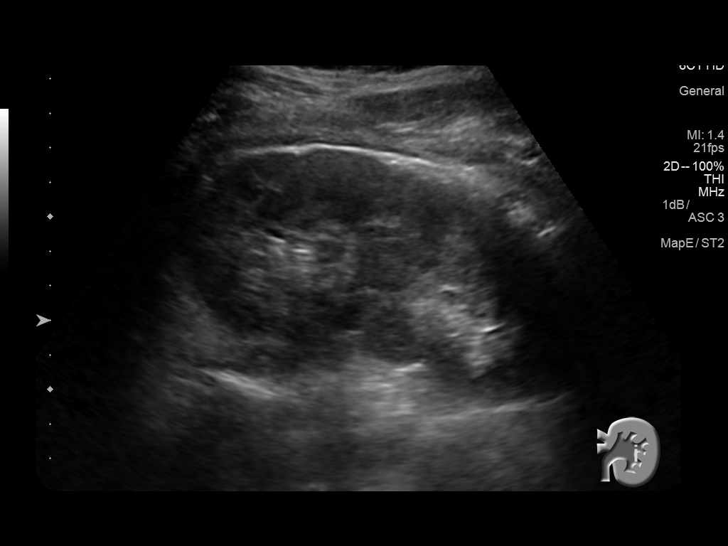
[im 19/29]
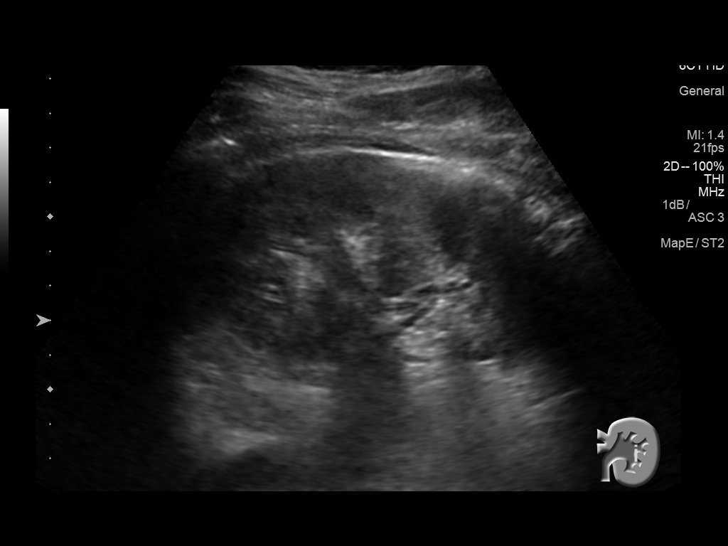
[im 22/29]
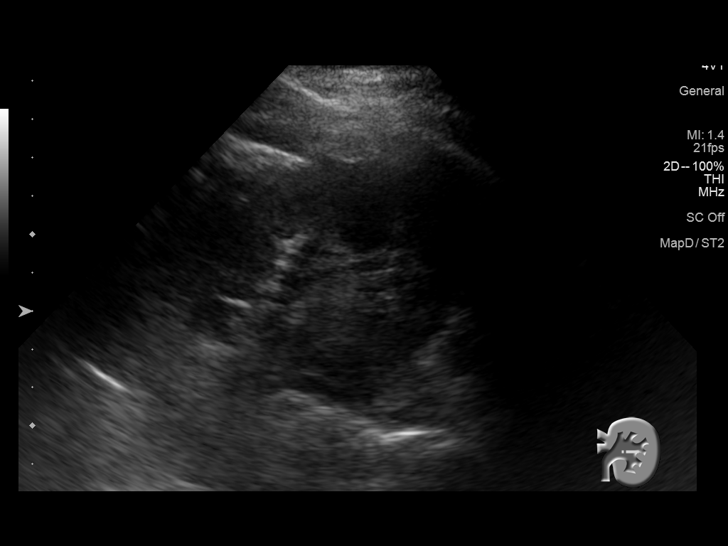
[im 24/29]
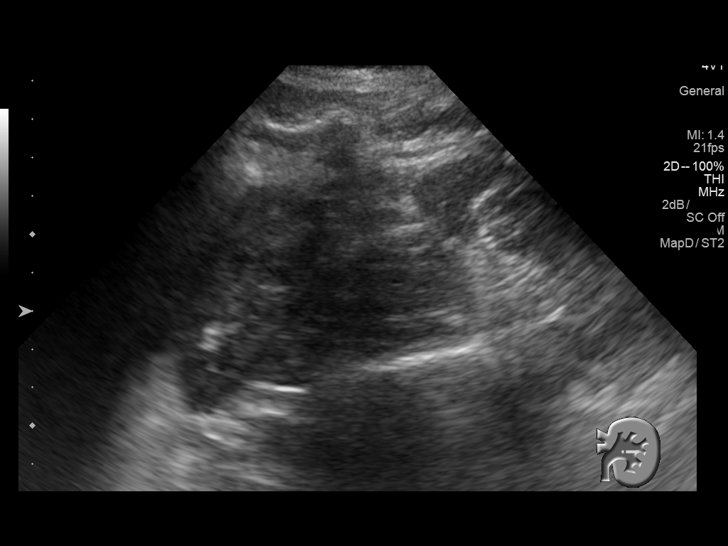
[im 26/29]
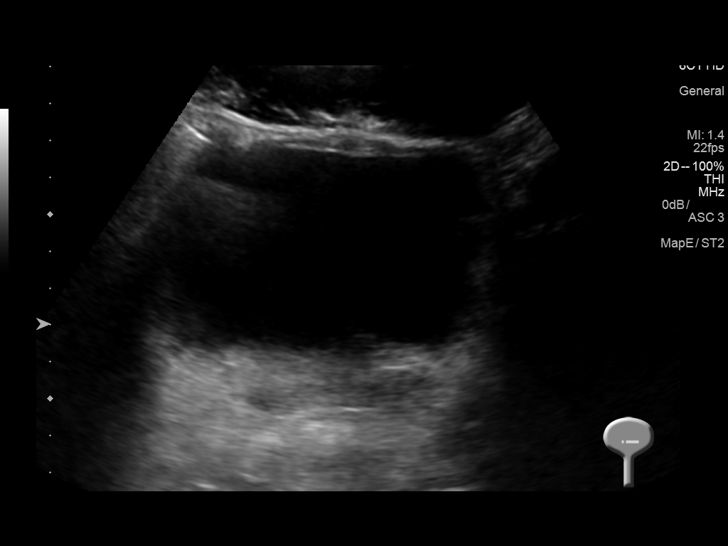
[im 29/29]
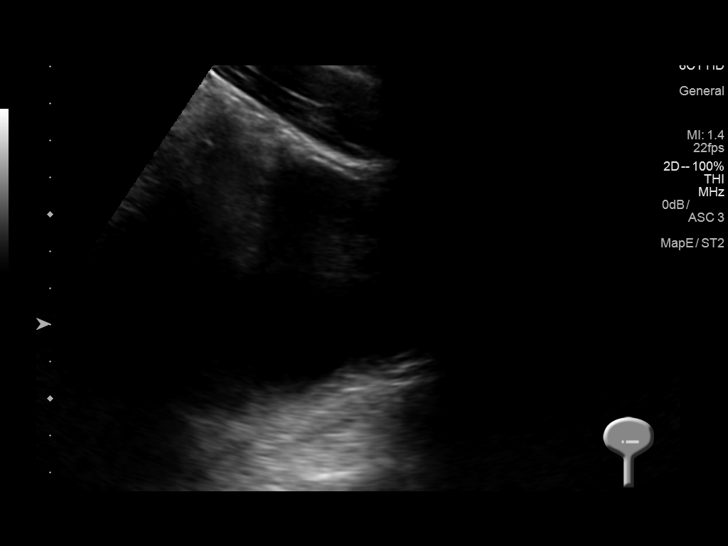

[14 of 25 positions shown; findings below may reference images not displayed]

FINDINGS: Right Kidney:

Length: 10.9 cm. Echogenicity within normal limits. No mass or
hydronephrosis visualized.

Left Kidney:

Length: 11.5 cm. Some portions not well seen. Echogenicity within
normal limits where seen. No mass or hydronephrosis visualized.

Bladder:

Appears normal for degree of bladder distention.
IMPRESSION: Normal renal ultrasound.  No hydronephrosis.
